# Patient Record
Sex: Male | Born: 2000 | Race: Black or African American | Hispanic: No | Marital: Single | Smoking: Never smoker
Health system: Southern US, Community
[De-identification: ages and names within clinical notes are randomized; demographics above are authoritative.]

## PROBLEM LIST (undated history)

## (undated) HISTORY — PX: CIRCUMCISION: SUR203

---
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
## 2022-01-01 DIAGNOSIS — F913 Oppositional defiant disorder: Secondary | ICD-10-CM | POA: Diagnosis not present

## 2022-01-01 DIAGNOSIS — F901 Attention-deficit hyperactivity disorder, predominantly hyperactive type: Secondary | ICD-10-CM | POA: Diagnosis not present

## 2022-01-02 DIAGNOSIS — Z419 Encounter for procedure for purposes other than remedying health state, unspecified: Secondary | ICD-10-CM | POA: Diagnosis not present

## 2022-01-08 DIAGNOSIS — F901 Attention-deficit hyperactivity disorder, predominantly hyperactive type: Secondary | ICD-10-CM | POA: Diagnosis not present

## 2022-01-08 DIAGNOSIS — F913 Oppositional defiant disorder: Secondary | ICD-10-CM | POA: Diagnosis not present

## 2022-01-15 DIAGNOSIS — F913 Oppositional defiant disorder: Secondary | ICD-10-CM | POA: Diagnosis not present

## 2022-01-15 DIAGNOSIS — F901 Attention-deficit hyperactivity disorder, predominantly hyperactive type: Secondary | ICD-10-CM | POA: Diagnosis not present

## 2022-01-22 DIAGNOSIS — F913 Oppositional defiant disorder: Secondary | ICD-10-CM | POA: Diagnosis not present

## 2022-01-22 DIAGNOSIS — F901 Attention-deficit hyperactivity disorder, predominantly hyperactive type: Secondary | ICD-10-CM | POA: Diagnosis not present

## 2022-02-01 DIAGNOSIS — F901 Attention-deficit hyperactivity disorder, predominantly hyperactive type: Secondary | ICD-10-CM | POA: Diagnosis not present

## 2022-02-01 DIAGNOSIS — F913 Oppositional defiant disorder: Secondary | ICD-10-CM | POA: Diagnosis not present

## 2022-02-02 DIAGNOSIS — Z419 Encounter for procedure for purposes other than remedying health state, unspecified: Secondary | ICD-10-CM | POA: Diagnosis not present

## 2022-02-05 DIAGNOSIS — F913 Oppositional defiant disorder: Secondary | ICD-10-CM | POA: Diagnosis not present

## 2022-02-05 DIAGNOSIS — F901 Attention-deficit hyperactivity disorder, predominantly hyperactive type: Secondary | ICD-10-CM | POA: Diagnosis not present

## 2022-02-12 DIAGNOSIS — F913 Oppositional defiant disorder: Secondary | ICD-10-CM | POA: Diagnosis not present

## 2022-02-12 DIAGNOSIS — F901 Attention-deficit hyperactivity disorder, predominantly hyperactive type: Secondary | ICD-10-CM | POA: Diagnosis not present

## 2022-02-19 DIAGNOSIS — F913 Oppositional defiant disorder: Secondary | ICD-10-CM | POA: Diagnosis not present

## 2022-02-19 DIAGNOSIS — F901 Attention-deficit hyperactivity disorder, predominantly hyperactive type: Secondary | ICD-10-CM | POA: Diagnosis not present

## 2022-02-26 DIAGNOSIS — F913 Oppositional defiant disorder: Secondary | ICD-10-CM | POA: Diagnosis not present

## 2022-02-26 DIAGNOSIS — F901 Attention-deficit hyperactivity disorder, predominantly hyperactive type: Secondary | ICD-10-CM | POA: Diagnosis not present

## 2022-03-04 DIAGNOSIS — Z419 Encounter for procedure for purposes other than remedying health state, unspecified: Secondary | ICD-10-CM | POA: Diagnosis not present

## 2022-03-05 DIAGNOSIS — F913 Oppositional defiant disorder: Secondary | ICD-10-CM | POA: Diagnosis not present

## 2022-03-05 DIAGNOSIS — F901 Attention-deficit hyperactivity disorder, predominantly hyperactive type: Secondary | ICD-10-CM | POA: Diagnosis not present

## 2022-03-12 DIAGNOSIS — F901 Attention-deficit hyperactivity disorder, predominantly hyperactive type: Secondary | ICD-10-CM | POA: Diagnosis not present

## 2022-03-12 DIAGNOSIS — F913 Oppositional defiant disorder: Secondary | ICD-10-CM | POA: Diagnosis not present

## 2022-03-19 DIAGNOSIS — F913 Oppositional defiant disorder: Secondary | ICD-10-CM | POA: Diagnosis not present

## 2022-03-19 DIAGNOSIS — F901 Attention-deficit hyperactivity disorder, predominantly hyperactive type: Secondary | ICD-10-CM | POA: Diagnosis not present

## 2022-03-26 DIAGNOSIS — F913 Oppositional defiant disorder: Secondary | ICD-10-CM | POA: Diagnosis not present

## 2022-03-26 DIAGNOSIS — F901 Attention-deficit hyperactivity disorder, predominantly hyperactive type: Secondary | ICD-10-CM | POA: Diagnosis not present

## 2022-04-02 DIAGNOSIS — F913 Oppositional defiant disorder: Secondary | ICD-10-CM | POA: Diagnosis not present

## 2022-04-02 DIAGNOSIS — F901 Attention-deficit hyperactivity disorder, predominantly hyperactive type: Secondary | ICD-10-CM | POA: Diagnosis not present

## 2022-04-04 DIAGNOSIS — Z419 Encounter for procedure for purposes other than remedying health state, unspecified: Secondary | ICD-10-CM | POA: Diagnosis not present

## 2022-04-09 DIAGNOSIS — F913 Oppositional defiant disorder: Secondary | ICD-10-CM | POA: Diagnosis not present

## 2022-04-09 DIAGNOSIS — F901 Attention-deficit hyperactivity disorder, predominantly hyperactive type: Secondary | ICD-10-CM | POA: Diagnosis not present

## 2022-04-16 DIAGNOSIS — F913 Oppositional defiant disorder: Secondary | ICD-10-CM | POA: Diagnosis not present

## 2022-04-16 DIAGNOSIS — F901 Attention-deficit hyperactivity disorder, predominantly hyperactive type: Secondary | ICD-10-CM | POA: Diagnosis not present

## 2022-04-23 DIAGNOSIS — F901 Attention-deficit hyperactivity disorder, predominantly hyperactive type: Secondary | ICD-10-CM | POA: Diagnosis not present

## 2022-04-23 DIAGNOSIS — F913 Oppositional defiant disorder: Secondary | ICD-10-CM | POA: Diagnosis not present

## 2022-04-30 DIAGNOSIS — F901 Attention-deficit hyperactivity disorder, predominantly hyperactive type: Secondary | ICD-10-CM | POA: Diagnosis not present

## 2022-04-30 DIAGNOSIS — F913 Oppositional defiant disorder: Secondary | ICD-10-CM | POA: Diagnosis not present

## 2022-05-04 DIAGNOSIS — Z419 Encounter for procedure for purposes other than remedying health state, unspecified: Secondary | ICD-10-CM | POA: Diagnosis not present

## 2022-05-07 DIAGNOSIS — F913 Oppositional defiant disorder: Secondary | ICD-10-CM | POA: Diagnosis not present

## 2022-05-07 DIAGNOSIS — F901 Attention-deficit hyperactivity disorder, predominantly hyperactive type: Secondary | ICD-10-CM | POA: Diagnosis not present

## 2022-05-14 DIAGNOSIS — F901 Attention-deficit hyperactivity disorder, predominantly hyperactive type: Secondary | ICD-10-CM | POA: Diagnosis not present

## 2022-05-14 DIAGNOSIS — F913 Oppositional defiant disorder: Secondary | ICD-10-CM | POA: Diagnosis not present

## 2022-05-21 DIAGNOSIS — F913 Oppositional defiant disorder: Secondary | ICD-10-CM | POA: Diagnosis not present

## 2022-05-21 DIAGNOSIS — F901 Attention-deficit hyperactivity disorder, predominantly hyperactive type: Secondary | ICD-10-CM | POA: Diagnosis not present

## 2022-06-04 DIAGNOSIS — F913 Oppositional defiant disorder: Secondary | ICD-10-CM | POA: Diagnosis not present

## 2022-06-04 DIAGNOSIS — F901 Attention-deficit hyperactivity disorder, predominantly hyperactive type: Secondary | ICD-10-CM | POA: Diagnosis not present

## 2022-06-04 DIAGNOSIS — Z419 Encounter for procedure for purposes other than remedying health state, unspecified: Secondary | ICD-10-CM | POA: Diagnosis not present

## 2022-06-11 DIAGNOSIS — F913 Oppositional defiant disorder: Secondary | ICD-10-CM | POA: Diagnosis not present

## 2022-06-11 DIAGNOSIS — F901 Attention-deficit hyperactivity disorder, predominantly hyperactive type: Secondary | ICD-10-CM | POA: Diagnosis not present

## 2022-06-18 DIAGNOSIS — F901 Attention-deficit hyperactivity disorder, predominantly hyperactive type: Secondary | ICD-10-CM | POA: Diagnosis not present

## 2022-06-18 DIAGNOSIS — F913 Oppositional defiant disorder: Secondary | ICD-10-CM | POA: Diagnosis not present

## 2022-06-25 DIAGNOSIS — F913 Oppositional defiant disorder: Secondary | ICD-10-CM | POA: Diagnosis not present

## 2022-06-25 DIAGNOSIS — F901 Attention-deficit hyperactivity disorder, predominantly hyperactive type: Secondary | ICD-10-CM | POA: Diagnosis not present

## 2022-07-02 DIAGNOSIS — F913 Oppositional defiant disorder: Secondary | ICD-10-CM | POA: Diagnosis not present

## 2022-07-02 DIAGNOSIS — F901 Attention-deficit hyperactivity disorder, predominantly hyperactive type: Secondary | ICD-10-CM | POA: Diagnosis not present

## 2022-07-05 DIAGNOSIS — Z419 Encounter for procedure for purposes other than remedying health state, unspecified: Secondary | ICD-10-CM | POA: Diagnosis not present

## 2022-07-09 DIAGNOSIS — F901 Attention-deficit hyperactivity disorder, predominantly hyperactive type: Secondary | ICD-10-CM | POA: Diagnosis not present

## 2022-07-09 DIAGNOSIS — F913 Oppositional defiant disorder: Secondary | ICD-10-CM | POA: Diagnosis not present

## 2022-07-16 DIAGNOSIS — F901 Attention-deficit hyperactivity disorder, predominantly hyperactive type: Secondary | ICD-10-CM | POA: Diagnosis not present

## 2022-07-16 DIAGNOSIS — F913 Oppositional defiant disorder: Secondary | ICD-10-CM | POA: Diagnosis not present

## 2022-07-23 DIAGNOSIS — F901 Attention-deficit hyperactivity disorder, predominantly hyperactive type: Secondary | ICD-10-CM | POA: Diagnosis not present

## 2022-07-23 DIAGNOSIS — F913 Oppositional defiant disorder: Secondary | ICD-10-CM | POA: Diagnosis not present

## 2022-07-30 DIAGNOSIS — F913 Oppositional defiant disorder: Secondary | ICD-10-CM | POA: Diagnosis not present

## 2022-07-30 DIAGNOSIS — F901 Attention-deficit hyperactivity disorder, predominantly hyperactive type: Secondary | ICD-10-CM | POA: Diagnosis not present

## 2022-08-03 DIAGNOSIS — Z419 Encounter for procedure for purposes other than remedying health state, unspecified: Secondary | ICD-10-CM | POA: Diagnosis not present

## 2022-08-06 DIAGNOSIS — F913 Oppositional defiant disorder: Secondary | ICD-10-CM | POA: Diagnosis not present

## 2022-08-06 DIAGNOSIS — F901 Attention-deficit hyperactivity disorder, predominantly hyperactive type: Secondary | ICD-10-CM | POA: Diagnosis not present

## 2022-08-13 DIAGNOSIS — F913 Oppositional defiant disorder: Secondary | ICD-10-CM | POA: Diagnosis not present

## 2022-08-13 DIAGNOSIS — F901 Attention-deficit hyperactivity disorder, predominantly hyperactive type: Secondary | ICD-10-CM | POA: Diagnosis not present

## 2022-08-20 DIAGNOSIS — F913 Oppositional defiant disorder: Secondary | ICD-10-CM | POA: Diagnosis not present

## 2022-08-20 DIAGNOSIS — F901 Attention-deficit hyperactivity disorder, predominantly hyperactive type: Secondary | ICD-10-CM | POA: Diagnosis not present

## 2022-08-27 DIAGNOSIS — F913 Oppositional defiant disorder: Secondary | ICD-10-CM | POA: Diagnosis not present

## 2022-08-27 DIAGNOSIS — F901 Attention-deficit hyperactivity disorder, predominantly hyperactive type: Secondary | ICD-10-CM | POA: Diagnosis not present

## 2022-09-03 DIAGNOSIS — F901 Attention-deficit hyperactivity disorder, predominantly hyperactive type: Secondary | ICD-10-CM | POA: Diagnosis not present

## 2022-09-03 DIAGNOSIS — F913 Oppositional defiant disorder: Secondary | ICD-10-CM | POA: Diagnosis not present

## 2022-09-03 DIAGNOSIS — Z419 Encounter for procedure for purposes other than remedying health state, unspecified: Secondary | ICD-10-CM | POA: Diagnosis not present

## 2022-09-10 DIAGNOSIS — F913 Oppositional defiant disorder: Secondary | ICD-10-CM | POA: Diagnosis not present

## 2022-09-10 DIAGNOSIS — F901 Attention-deficit hyperactivity disorder, predominantly hyperactive type: Secondary | ICD-10-CM | POA: Diagnosis not present

## 2022-09-17 DIAGNOSIS — F913 Oppositional defiant disorder: Secondary | ICD-10-CM | POA: Diagnosis not present

## 2022-09-17 DIAGNOSIS — F901 Attention-deficit hyperactivity disorder, predominantly hyperactive type: Secondary | ICD-10-CM | POA: Diagnosis not present

## 2022-09-24 DIAGNOSIS — F901 Attention-deficit hyperactivity disorder, predominantly hyperactive type: Secondary | ICD-10-CM | POA: Diagnosis not present

## 2022-09-24 DIAGNOSIS — F913 Oppositional defiant disorder: Secondary | ICD-10-CM | POA: Diagnosis not present

## 2022-10-01 DIAGNOSIS — F901 Attention-deficit hyperactivity disorder, predominantly hyperactive type: Secondary | ICD-10-CM | POA: Diagnosis not present

## 2022-10-01 DIAGNOSIS — F913 Oppositional defiant disorder: Secondary | ICD-10-CM | POA: Diagnosis not present

## 2022-10-03 DIAGNOSIS — Z419 Encounter for procedure for purposes other than remedying health state, unspecified: Secondary | ICD-10-CM | POA: Diagnosis not present

## 2022-10-08 DIAGNOSIS — F913 Oppositional defiant disorder: Secondary | ICD-10-CM | POA: Diagnosis not present

## 2022-10-08 DIAGNOSIS — F901 Attention-deficit hyperactivity disorder, predominantly hyperactive type: Secondary | ICD-10-CM | POA: Diagnosis not present

## 2022-10-15 DIAGNOSIS — F901 Attention-deficit hyperactivity disorder, predominantly hyperactive type: Secondary | ICD-10-CM | POA: Diagnosis not present

## 2022-10-15 DIAGNOSIS — F913 Oppositional defiant disorder: Secondary | ICD-10-CM | POA: Diagnosis not present

## 2022-10-22 DIAGNOSIS — F913 Oppositional defiant disorder: Secondary | ICD-10-CM | POA: Diagnosis not present

## 2022-10-22 DIAGNOSIS — F901 Attention-deficit hyperactivity disorder, predominantly hyperactive type: Secondary | ICD-10-CM | POA: Diagnosis not present

## 2022-10-29 DIAGNOSIS — F913 Oppositional defiant disorder: Secondary | ICD-10-CM | POA: Diagnosis not present

## 2022-10-29 DIAGNOSIS — F901 Attention-deficit hyperactivity disorder, predominantly hyperactive type: Secondary | ICD-10-CM | POA: Diagnosis not present

## 2022-11-03 DIAGNOSIS — Z419 Encounter for procedure for purposes other than remedying health state, unspecified: Secondary | ICD-10-CM | POA: Diagnosis not present

## 2022-12-03 DIAGNOSIS — Z419 Encounter for procedure for purposes other than remedying health state, unspecified: Secondary | ICD-10-CM | POA: Diagnosis not present

## 2022-12-03 DIAGNOSIS — F901 Attention-deficit hyperactivity disorder, predominantly hyperactive type: Secondary | ICD-10-CM | POA: Diagnosis not present

## 2022-12-03 DIAGNOSIS — F913 Oppositional defiant disorder: Secondary | ICD-10-CM | POA: Diagnosis not present

## 2022-12-10 DIAGNOSIS — F913 Oppositional defiant disorder: Secondary | ICD-10-CM | POA: Diagnosis not present

## 2022-12-10 DIAGNOSIS — F901 Attention-deficit hyperactivity disorder, predominantly hyperactive type: Secondary | ICD-10-CM | POA: Diagnosis not present

## 2022-12-17 DIAGNOSIS — F913 Oppositional defiant disorder: Secondary | ICD-10-CM | POA: Diagnosis not present

## 2022-12-17 DIAGNOSIS — F901 Attention-deficit hyperactivity disorder, predominantly hyperactive type: Secondary | ICD-10-CM | POA: Diagnosis not present

## 2022-12-24 DIAGNOSIS — F901 Attention-deficit hyperactivity disorder, predominantly hyperactive type: Secondary | ICD-10-CM | POA: Diagnosis not present

## 2022-12-24 DIAGNOSIS — F913 Oppositional defiant disorder: Secondary | ICD-10-CM | POA: Diagnosis not present

## 2022-12-31 DIAGNOSIS — F913 Oppositional defiant disorder: Secondary | ICD-10-CM | POA: Diagnosis not present

## 2022-12-31 DIAGNOSIS — F901 Attention-deficit hyperactivity disorder, predominantly hyperactive type: Secondary | ICD-10-CM | POA: Diagnosis not present

## 2023-01-03 DIAGNOSIS — Z419 Encounter for procedure for purposes other than remedying health state, unspecified: Secondary | ICD-10-CM | POA: Diagnosis not present

## 2023-01-07 DIAGNOSIS — F901 Attention-deficit hyperactivity disorder, predominantly hyperactive type: Secondary | ICD-10-CM | POA: Diagnosis not present

## 2023-01-07 DIAGNOSIS — F913 Oppositional defiant disorder: Secondary | ICD-10-CM | POA: Diagnosis not present

## 2023-01-14 DIAGNOSIS — F913 Oppositional defiant disorder: Secondary | ICD-10-CM | POA: Diagnosis not present

## 2023-01-14 DIAGNOSIS — F901 Attention-deficit hyperactivity disorder, predominantly hyperactive type: Secondary | ICD-10-CM | POA: Diagnosis not present

## 2023-01-21 DIAGNOSIS — F913 Oppositional defiant disorder: Secondary | ICD-10-CM | POA: Diagnosis not present

## 2023-01-21 DIAGNOSIS — F901 Attention-deficit hyperactivity disorder, predominantly hyperactive type: Secondary | ICD-10-CM | POA: Diagnosis not present

## 2023-01-28 DIAGNOSIS — F913 Oppositional defiant disorder: Secondary | ICD-10-CM | POA: Diagnosis not present

## 2023-01-28 DIAGNOSIS — F901 Attention-deficit hyperactivity disorder, predominantly hyperactive type: Secondary | ICD-10-CM | POA: Diagnosis not present

## 2023-02-03 DIAGNOSIS — Z419 Encounter for procedure for purposes other than remedying health state, unspecified: Secondary | ICD-10-CM | POA: Diagnosis not present

## 2023-02-04 DIAGNOSIS — F913 Oppositional defiant disorder: Secondary | ICD-10-CM | POA: Diagnosis not present

## 2023-02-04 DIAGNOSIS — F901 Attention-deficit hyperactivity disorder, predominantly hyperactive type: Secondary | ICD-10-CM | POA: Diagnosis not present

## 2023-02-11 DIAGNOSIS — F901 Attention-deficit hyperactivity disorder, predominantly hyperactive type: Secondary | ICD-10-CM | POA: Diagnosis not present

## 2023-02-11 DIAGNOSIS — F913 Oppositional defiant disorder: Secondary | ICD-10-CM | POA: Diagnosis not present

## 2023-02-18 DIAGNOSIS — F901 Attention-deficit hyperactivity disorder, predominantly hyperactive type: Secondary | ICD-10-CM | POA: Diagnosis not present

## 2023-02-18 DIAGNOSIS — F913 Oppositional defiant disorder: Secondary | ICD-10-CM | POA: Diagnosis not present

## 2023-02-25 DIAGNOSIS — F913 Oppositional defiant disorder: Secondary | ICD-10-CM | POA: Diagnosis not present

## 2023-02-25 DIAGNOSIS — F901 Attention-deficit hyperactivity disorder, predominantly hyperactive type: Secondary | ICD-10-CM | POA: Diagnosis not present

## 2023-03-04 DIAGNOSIS — F913 Oppositional defiant disorder: Secondary | ICD-10-CM | POA: Diagnosis not present

## 2023-03-04 DIAGNOSIS — F901 Attention-deficit hyperactivity disorder, predominantly hyperactive type: Secondary | ICD-10-CM | POA: Diagnosis not present

## 2023-03-05 DIAGNOSIS — Z419 Encounter for procedure for purposes other than remedying health state, unspecified: Secondary | ICD-10-CM | POA: Diagnosis not present

## 2023-03-11 DIAGNOSIS — F913 Oppositional defiant disorder: Secondary | ICD-10-CM | POA: Diagnosis not present

## 2023-03-11 DIAGNOSIS — F901 Attention-deficit hyperactivity disorder, predominantly hyperactive type: Secondary | ICD-10-CM | POA: Diagnosis not present

## 2023-03-18 DIAGNOSIS — F913 Oppositional defiant disorder: Secondary | ICD-10-CM | POA: Diagnosis not present

## 2023-03-18 DIAGNOSIS — F901 Attention-deficit hyperactivity disorder, predominantly hyperactive type: Secondary | ICD-10-CM | POA: Diagnosis not present

## 2023-03-25 DIAGNOSIS — F913 Oppositional defiant disorder: Secondary | ICD-10-CM | POA: Diagnosis not present

## 2023-03-25 DIAGNOSIS — F901 Attention-deficit hyperactivity disorder, predominantly hyperactive type: Secondary | ICD-10-CM | POA: Diagnosis not present

## 2023-04-01 DIAGNOSIS — F901 Attention-deficit hyperactivity disorder, predominantly hyperactive type: Secondary | ICD-10-CM | POA: Diagnosis not present

## 2023-04-01 DIAGNOSIS — F913 Oppositional defiant disorder: Secondary | ICD-10-CM | POA: Diagnosis not present

## 2023-04-05 DIAGNOSIS — Z419 Encounter for procedure for purposes other than remedying health state, unspecified: Secondary | ICD-10-CM | POA: Diagnosis not present

## 2023-04-08 DIAGNOSIS — F913 Oppositional defiant disorder: Secondary | ICD-10-CM | POA: Diagnosis not present

## 2023-04-08 DIAGNOSIS — F901 Attention-deficit hyperactivity disorder, predominantly hyperactive type: Secondary | ICD-10-CM | POA: Diagnosis not present

## 2023-04-15 DIAGNOSIS — F901 Attention-deficit hyperactivity disorder, predominantly hyperactive type: Secondary | ICD-10-CM | POA: Diagnosis not present

## 2023-04-15 DIAGNOSIS — F913 Oppositional defiant disorder: Secondary | ICD-10-CM | POA: Diagnosis not present

## 2023-04-22 DIAGNOSIS — F913 Oppositional defiant disorder: Secondary | ICD-10-CM | POA: Diagnosis not present

## 2023-04-22 DIAGNOSIS — F901 Attention-deficit hyperactivity disorder, predominantly hyperactive type: Secondary | ICD-10-CM | POA: Diagnosis not present

## 2023-04-29 DIAGNOSIS — F913 Oppositional defiant disorder: Secondary | ICD-10-CM | POA: Diagnosis not present

## 2023-04-29 DIAGNOSIS — F901 Attention-deficit hyperactivity disorder, predominantly hyperactive type: Secondary | ICD-10-CM | POA: Diagnosis not present

## 2023-05-05 DIAGNOSIS — Z419 Encounter for procedure for purposes other than remedying health state, unspecified: Secondary | ICD-10-CM | POA: Diagnosis not present

## 2023-05-06 DIAGNOSIS — F901 Attention-deficit hyperactivity disorder, predominantly hyperactive type: Secondary | ICD-10-CM | POA: Diagnosis not present

## 2023-05-06 DIAGNOSIS — F913 Oppositional defiant disorder: Secondary | ICD-10-CM | POA: Diagnosis not present

## 2023-05-13 DIAGNOSIS — F913 Oppositional defiant disorder: Secondary | ICD-10-CM | POA: Diagnosis not present

## 2023-05-13 DIAGNOSIS — F901 Attention-deficit hyperactivity disorder, predominantly hyperactive type: Secondary | ICD-10-CM | POA: Diagnosis not present

## 2023-05-20 DIAGNOSIS — F913 Oppositional defiant disorder: Secondary | ICD-10-CM | POA: Diagnosis not present

## 2023-05-20 DIAGNOSIS — F901 Attention-deficit hyperactivity disorder, predominantly hyperactive type: Secondary | ICD-10-CM | POA: Diagnosis not present

## 2023-05-27 DIAGNOSIS — F901 Attention-deficit hyperactivity disorder, predominantly hyperactive type: Secondary | ICD-10-CM | POA: Diagnosis not present

## 2023-05-27 DIAGNOSIS — F913 Oppositional defiant disorder: Secondary | ICD-10-CM | POA: Diagnosis not present

## 2023-06-03 DIAGNOSIS — F901 Attention-deficit hyperactivity disorder, predominantly hyperactive type: Secondary | ICD-10-CM | POA: Diagnosis not present

## 2023-06-03 DIAGNOSIS — F913 Oppositional defiant disorder: Secondary | ICD-10-CM | POA: Diagnosis not present

## 2023-06-05 DIAGNOSIS — Z419 Encounter for procedure for purposes other than remedying health state, unspecified: Secondary | ICD-10-CM | POA: Diagnosis not present

## 2023-06-10 DIAGNOSIS — F901 Attention-deficit hyperactivity disorder, predominantly hyperactive type: Secondary | ICD-10-CM | POA: Diagnosis not present

## 2023-06-10 DIAGNOSIS — F913 Oppositional defiant disorder: Secondary | ICD-10-CM | POA: Diagnosis not present

## 2023-06-17 DIAGNOSIS — F901 Attention-deficit hyperactivity disorder, predominantly hyperactive type: Secondary | ICD-10-CM | POA: Diagnosis not present

## 2023-06-17 DIAGNOSIS — F913 Oppositional defiant disorder: Secondary | ICD-10-CM | POA: Diagnosis not present

## 2023-06-24 DIAGNOSIS — F901 Attention-deficit hyperactivity disorder, predominantly hyperactive type: Secondary | ICD-10-CM | POA: Diagnosis not present

## 2023-06-24 DIAGNOSIS — F913 Oppositional defiant disorder: Secondary | ICD-10-CM | POA: Diagnosis not present

## 2023-07-01 DIAGNOSIS — F913 Oppositional defiant disorder: Secondary | ICD-10-CM | POA: Diagnosis not present

## 2023-07-01 DIAGNOSIS — F901 Attention-deficit hyperactivity disorder, predominantly hyperactive type: Secondary | ICD-10-CM | POA: Diagnosis not present

## 2023-07-06 DIAGNOSIS — Z419 Encounter for procedure for purposes other than remedying health state, unspecified: Secondary | ICD-10-CM | POA: Diagnosis not present

## 2023-07-08 DIAGNOSIS — F901 Attention-deficit hyperactivity disorder, predominantly hyperactive type: Secondary | ICD-10-CM | POA: Diagnosis not present

## 2023-07-08 DIAGNOSIS — F913 Oppositional defiant disorder: Secondary | ICD-10-CM | POA: Diagnosis not present

## 2023-07-15 DIAGNOSIS — F901 Attention-deficit hyperactivity disorder, predominantly hyperactive type: Secondary | ICD-10-CM | POA: Diagnosis not present

## 2023-07-15 DIAGNOSIS — F913 Oppositional defiant disorder: Secondary | ICD-10-CM | POA: Diagnosis not present

## 2023-07-22 DIAGNOSIS — F913 Oppositional defiant disorder: Secondary | ICD-10-CM | POA: Diagnosis not present

## 2023-07-22 DIAGNOSIS — F901 Attention-deficit hyperactivity disorder, predominantly hyperactive type: Secondary | ICD-10-CM | POA: Diagnosis not present

## 2023-07-29 DIAGNOSIS — F901 Attention-deficit hyperactivity disorder, predominantly hyperactive type: Secondary | ICD-10-CM | POA: Diagnosis not present

## 2023-07-29 DIAGNOSIS — F913 Oppositional defiant disorder: Secondary | ICD-10-CM | POA: Diagnosis not present

## 2023-08-03 DIAGNOSIS — Z419 Encounter for procedure for purposes other than remedying health state, unspecified: Secondary | ICD-10-CM | POA: Diagnosis not present

## 2023-08-05 DIAGNOSIS — F913 Oppositional defiant disorder: Secondary | ICD-10-CM | POA: Diagnosis not present

## 2023-08-05 DIAGNOSIS — F901 Attention-deficit hyperactivity disorder, predominantly hyperactive type: Secondary | ICD-10-CM | POA: Diagnosis not present

## 2023-08-12 DIAGNOSIS — F913 Oppositional defiant disorder: Secondary | ICD-10-CM | POA: Diagnosis not present

## 2023-08-12 DIAGNOSIS — F901 Attention-deficit hyperactivity disorder, predominantly hyperactive type: Secondary | ICD-10-CM | POA: Diagnosis not present

## 2023-08-19 DIAGNOSIS — F901 Attention-deficit hyperactivity disorder, predominantly hyperactive type: Secondary | ICD-10-CM | POA: Diagnosis not present

## 2023-08-19 DIAGNOSIS — F913 Oppositional defiant disorder: Secondary | ICD-10-CM | POA: Diagnosis not present

## 2023-08-26 DIAGNOSIS — F901 Attention-deficit hyperactivity disorder, predominantly hyperactive type: Secondary | ICD-10-CM | POA: Diagnosis not present

## 2023-08-26 DIAGNOSIS — F913 Oppositional defiant disorder: Secondary | ICD-10-CM | POA: Diagnosis not present

## 2023-09-02 DIAGNOSIS — F913 Oppositional defiant disorder: Secondary | ICD-10-CM | POA: Diagnosis not present

## 2023-09-02 DIAGNOSIS — F901 Attention-deficit hyperactivity disorder, predominantly hyperactive type: Secondary | ICD-10-CM | POA: Diagnosis not present

## 2023-09-06 DIAGNOSIS — M79641 Pain in right hand: Secondary | ICD-10-CM | POA: Diagnosis not present

## 2023-09-06 DIAGNOSIS — S62339A Displaced fracture of neck of unspecified metacarpal bone, initial encounter for closed fracture: Secondary | ICD-10-CM | POA: Diagnosis not present

## 2023-09-06 DIAGNOSIS — W2201XA Walked into wall, initial encounter: Secondary | ICD-10-CM | POA: Diagnosis not present

## 2023-09-06 DIAGNOSIS — S62316A Displaced fracture of base of fifth metacarpal bone, right hand, initial encounter for closed fracture: Secondary | ICD-10-CM | POA: Diagnosis not present

## 2023-09-09 DIAGNOSIS — F901 Attention-deficit hyperactivity disorder, predominantly hyperactive type: Secondary | ICD-10-CM | POA: Diagnosis not present

## 2023-09-09 DIAGNOSIS — F913 Oppositional defiant disorder: Secondary | ICD-10-CM | POA: Diagnosis not present

## 2023-09-13 DIAGNOSIS — S62316A Displaced fracture of base of fifth metacarpal bone, right hand, initial encounter for closed fracture: Secondary | ICD-10-CM | POA: Diagnosis not present

## 2023-09-13 DIAGNOSIS — M79641 Pain in right hand: Secondary | ICD-10-CM | POA: Diagnosis not present

## 2023-09-14 DIAGNOSIS — Z419 Encounter for procedure for purposes other than remedying health state, unspecified: Secondary | ICD-10-CM | POA: Diagnosis not present

## 2023-09-16 DIAGNOSIS — F913 Oppositional defiant disorder: Secondary | ICD-10-CM | POA: Diagnosis not present

## 2023-09-16 DIAGNOSIS — F901 Attention-deficit hyperactivity disorder, predominantly hyperactive type: Secondary | ICD-10-CM | POA: Diagnosis not present

## 2023-09-17 DIAGNOSIS — Z9889 Other specified postprocedural states: Secondary | ICD-10-CM | POA: Diagnosis not present

## 2023-09-17 DIAGNOSIS — S62306A Unspecified fracture of fifth metacarpal bone, right hand, initial encounter for closed fracture: Secondary | ICD-10-CM | POA: Diagnosis not present

## 2023-09-17 DIAGNOSIS — Z791 Long term (current) use of non-steroidal anti-inflammatories (NSAID): Secondary | ICD-10-CM | POA: Diagnosis not present

## 2023-09-17 DIAGNOSIS — Z79899 Other long term (current) drug therapy: Secondary | ICD-10-CM | POA: Diagnosis not present

## 2023-09-17 DIAGNOSIS — S62316A Displaced fracture of base of fifth metacarpal bone, right hand, initial encounter for closed fracture: Secondary | ICD-10-CM | POA: Diagnosis not present

## 2023-09-23 DIAGNOSIS — F913 Oppositional defiant disorder: Secondary | ICD-10-CM | POA: Diagnosis not present

## 2023-09-23 DIAGNOSIS — F901 Attention-deficit hyperactivity disorder, predominantly hyperactive type: Secondary | ICD-10-CM | POA: Diagnosis not present

## 2023-09-30 DIAGNOSIS — F901 Attention-deficit hyperactivity disorder, predominantly hyperactive type: Secondary | ICD-10-CM | POA: Diagnosis not present

## 2023-09-30 DIAGNOSIS — F913 Oppositional defiant disorder: Secondary | ICD-10-CM | POA: Diagnosis not present

## 2023-10-03 DIAGNOSIS — S62316A Displaced fracture of base of fifth metacarpal bone, right hand, initial encounter for closed fracture: Secondary | ICD-10-CM | POA: Diagnosis not present

## 2023-10-14 DIAGNOSIS — Z419 Encounter for procedure for purposes other than remedying health state, unspecified: Secondary | ICD-10-CM | POA: Diagnosis not present

## 2023-10-17 DIAGNOSIS — S62316A Displaced fracture of base of fifth metacarpal bone, right hand, initial encounter for closed fracture: Secondary | ICD-10-CM | POA: Diagnosis not present

## 2023-10-25 DIAGNOSIS — S62316A Displaced fracture of base of fifth metacarpal bone, right hand, initial encounter for closed fracture: Secondary | ICD-10-CM | POA: Diagnosis not present

## 2023-11-08 DIAGNOSIS — S62316A Displaced fracture of base of fifth metacarpal bone, right hand, initial encounter for closed fracture: Secondary | ICD-10-CM | POA: Diagnosis not present

## 2023-11-14 DIAGNOSIS — Z419 Encounter for procedure for purposes other than remedying health state, unspecified: Secondary | ICD-10-CM | POA: Diagnosis not present

## 2023-12-14 DIAGNOSIS — Z419 Encounter for procedure for purposes other than remedying health state, unspecified: Secondary | ICD-10-CM | POA: Diagnosis not present

## 2023-12-23 DIAGNOSIS — S62316P Displaced fracture of base of fifth metacarpal bone, right hand, subsequent encounter for fracture with malunion: Secondary | ICD-10-CM | POA: Diagnosis not present

## 2023-12-23 DIAGNOSIS — R600 Localized edema: Secondary | ICD-10-CM | POA: Diagnosis not present

## 2023-12-23 DIAGNOSIS — M25531 Pain in right wrist: Secondary | ICD-10-CM | POA: Diagnosis not present

## 2023-12-25 DIAGNOSIS — S62316P Displaced fracture of base of fifth metacarpal bone, right hand, subsequent encounter for fracture with malunion: Secondary | ICD-10-CM | POA: Diagnosis not present

## 2024-01-14 DIAGNOSIS — Z419 Encounter for procedure for purposes other than remedying health state, unspecified: Secondary | ICD-10-CM | POA: Diagnosis not present

## 2024-02-14 DIAGNOSIS — Z419 Encounter for procedure for purposes other than remedying health state, unspecified: Secondary | ICD-10-CM | POA: Diagnosis not present

## 2024-02-17 DIAGNOSIS — S62316P Displaced fracture of base of fifth metacarpal bone, right hand, subsequent encounter for fracture with malunion: Secondary | ICD-10-CM | POA: Diagnosis not present

## 2024-03-08 DIAGNOSIS — K047 Periapical abscess without sinus: Secondary | ICD-10-CM | POA: Diagnosis not present

## 2024-03-15 DIAGNOSIS — Z419 Encounter for procedure for purposes other than remedying health state, unspecified: Secondary | ICD-10-CM | POA: Diagnosis not present

## 2024-03-20 DIAGNOSIS — K0889 Other specified disorders of teeth and supporting structures: Secondary | ICD-10-CM | POA: Diagnosis not present
